# Patient Record
Sex: Female | Born: 1985 | Race: Black or African American | Hispanic: No | Marital: Single | Smoking: Never smoker
Health system: Southern US, Community
[De-identification: ages and names within clinical notes are randomized; demographics above are authoritative.]

## PROBLEM LIST (undated history)

## (undated) DIAGNOSIS — J45909 Unspecified asthma, uncomplicated: Secondary | ICD-10-CM

---

## 2016-06-03 ENCOUNTER — Ambulatory Visit (INDEPENDENT_AMBULATORY_CARE_PROVIDER_SITE_OTHER): Payer: Managed Care, Other (non HMO) | Admitting: Family Medicine

## 2016-06-03 VITALS — BP 130/80 | HR 89 | Temp 97.6°F | Resp 17 | Ht 67.0 in | Wt 307.0 lb

## 2016-06-03 DIAGNOSIS — N926 Irregular menstruation, unspecified: Secondary | ICD-10-CM | POA: Diagnosis not present

## 2016-06-03 DIAGNOSIS — R0689 Other abnormalities of breathing: Secondary | ICD-10-CM

## 2016-06-03 LAB — POCT CBC
GRANULOCYTE PERCENT: 60.7 % (ref 37–80)
HCT, POC: 36.4 % — AB (ref 37.7–47.9)
Hemoglobin: 12.4 g/dL (ref 12.2–16.2)
Lymph, poc: 3.2 (ref 0.6–3.4)
MCH: 26.8 pg — AB (ref 27–31.2)
MCHC: 34.1 g/dL (ref 31.8–35.4)
MCV: 78.7 fL — AB (ref 80–97)
MID (CBC): 0.4 (ref 0–0.9)
MPV: 7.3 fL (ref 0–99.8)
POC Granulocyte: 5.5 (ref 2–6.9)
POC LYMPH PERCENT: 35.1 %L (ref 10–50)
POC MID %: 4.2 % (ref 0–12)
Platelet Count, POC: 328 10*3/uL (ref 142–424)
RBC: 4.63 M/uL (ref 4.04–5.48)
RDW, POC: 14.7 %
WBC: 9.1 10*3/uL (ref 4.6–10.2)

## 2016-06-03 LAB — POCT URINALYSIS DIP (MANUAL ENTRY)
BILIRUBIN UA: NEGATIVE
GLUCOSE UA: NEGATIVE
Ketones, POC UA: NEGATIVE
Leukocytes, UA: NEGATIVE
NITRITE UA: NEGATIVE
Spec Grav, UA: >=1.03
Urobilinogen, UA: 0.2
pH, UA: 5.5

## 2016-06-03 LAB — POC MICROSCOPIC URINALYSIS (UMFC): MUCUS RE: ABSENT

## 2016-06-03 MED ORDER — ALBUTEROL SULFATE (2.5 MG/3ML) 0.083% IN NEBU
2.5000 mg | INHALATION_SOLUTION | Freq: Once | RESPIRATORY_TRACT | Status: AC
  Administered 2016-06-03: 2.5 mg via RESPIRATORY_TRACT

## 2016-06-03 MED ORDER — IPRATROPIUM BROMIDE 0.02 % IN SOLN
0.5000 mg | Freq: Once | RESPIRATORY_TRACT | Status: AC
  Administered 2016-06-03: 0.5 mg via RESPIRATORY_TRACT

## 2016-06-03 NOTE — Progress Notes (Signed)
   Patient ID: Annette Edwards, female    DOB: 12/16/1985, 31 y.o.   MRN: [id number]  PCP: No primary care provider on file.  Chief Complaint  Patient presents with  . abnormal period    Subjective:  HPI  31 year old presents for evaluation of abnormal menstrual cycle. Past medical hx: Asthma  Last menstrual period. Missed period during the month of December. Period has resumed 4 days ago and flowing excessively heavy. Soaking approximately 2-3 pads per hour. She has never engaged in sexual activity nor had a vaginal exam or pap smear. Headache, today, chills over the last few days. Unknown if urinary frequency is present. Changing pads every 20 minutes. Mildly dizziness.   Social History   Social History  . Marital status: Single    Spouse name: N/A  . Number of children: N/A  . Years of education: N/A   Occupational History  . Not on file.   Social History Main Topics  . Smoking status: Never Smoker  . Smokeless tobacco: Never Used  . Alcohol use No  . Drug use: No  . Sexual activity: No   Other Topics Concern  . Not on file   Social History Narrative  . No narrative on file   No family history on file. Review of Systems HPI There are no active problems to display for this patient.   Allergies not on file  Prior to Admission medications   Not on File    Past Medical, Surgical Family and Social History reviewed and updated.    Objective:   Today's Vitals   06/03/16 1725  BP: 130/80  Pulse: 89  Resp: 17  Temp: 97.6 F (36.4 C)  TempSrc: Oral  SpO2: 99%  Weight: (!) 307 lb (139.3 kg)  Height: 5\' 7"  (1.702 m)    Wt Readings from Last 3 Encounters:  06/03/16 (!) 307 lb (139.3 kg)   Physical Exam  Constitutional: She appears well-developed and well-nourished.  HENT:  Head: Normocephalic and atraumatic.  Nose: Nose normal.  Mouth/Throat: Oropharynx is clear and moist.  Eyes: Pupils are equal, round, and reactive to light.  Neck: Normal range  of motion. Neck supple.  Cardiovascular: Normal rate, regular rhythm, normal heart sounds and intact distal pulses.   Pulmonary/Chest: Effort normal.  Coarse posterior upper and middle lung sounds sounds  Abdominal: Soft. Bowel sounds are normal. She exhibits no distension and no mass. There is no tenderness. There is no rebound and no guarding.  Lymphadenopathy:    She has no cervical adenopathy.  Skin: Skin is warm and dry.  Psychiatric: She has a normal mood and affect. Her behavior is normal. Judgment and thought content normal.           Assessment & Plan:  1. Irregular menses - POCT CBC - POCT urinalysis dipstick - POCT Microscopic Urinalysis ([facility]) - Ambulatory referral to Gynecology  2. Adventitious breath sounds - albuterol (PROVENTIL) (2.5 MG/3ML) 0.083% nebulizer solution 2.5 mg; Take 3 mLs (2.5 mg total) by nebulization once. - ipratropium (ATROVENT) nebulizer solution 0.5 mg; Take 2.5 mLs (0.5 mg total) by nebulization once.   Godfrey PickKimberly S. Tiburcio PeaHarris, MSN, FNP-C [facility] at [facility] [phone]

## 2016-06-03 NOTE — Patient Instructions (Addendum)
I have placed an urgent referral for you to see Gynecology.  IF you received an x-ray today, you will receive an invoice from [facility]. Please contact [facility] at [phone] with questions or concerns regarding your invoice.   IF you received labwork today, you will receive an invoice from [facility]. Please contact [facility] at [phone] with questions or concerns regarding your invoice.   Our billing staff will not be able to assist you with questions regarding bills from these companies.  You will be contacted with the lab results as soon as they are available. The fastest way to get your results is to activate your My Chart account. Instructions are located on the last page of this paperwork. If you have not heard from us regarding the results in 2 weeks, please contact this office.      Abnormal Uterine Bleeding Abnormal uterine bleeding means bleeding from the vagina that is not your normal menstrual period. This can be:  Bleeding or spotting between periods.  Bleeding after sex (sexual intercourse).  Bleeding that is heavier or more than normal.  Periods that last longer than usual.  Bleeding after menopause. There are many problems that may cause this. Treatment will depend on the cause of the bleeding. Any kind of bleeding that is not normal should be reviewed by your doctor. Follow these instructions at home: Watch your condition for any changes. These actions may lessen any discomfort you are having:  Do not use tampons or douches as told by your doctor.  Change your pads often. You should get regular pelvic exams and Pap tests. Keep all appointments for tests as told by your doctor. Contact a doctor if:  You are bleeding for more than 1 week.  You feel dizzy at times. Get help right away if:  You pass out.  You have to change pads every 15 to 30 minutes.  You have belly pain.  You have a fever.  You become sweaty or weak.  You  are passing large blood clots from the vagina.  You feel sick to your stomach (nauseous) and throw up (vomit). This information is not intended to replace advice given to you by your health care provider. Make sure you discuss any questions you have with your health care provider. Document Released: 03/10/2009 Document Revised: 10/19/2015 Document Reviewed: 12/10/2012 Elsevier Interactive Patient Education  2017 [employer]

## 2016-06-04 ENCOUNTER — Telehealth: Payer: Self-pay | Admitting: Obstetrics and Gynecology

## 2016-06-04 NOTE — Telephone Encounter (Signed)
Called and left a message for patient to call back to schedule a new patient doctor referral. °

## 2016-06-05 NOTE — Telephone Encounter (Signed)
Called and left a message for patient to call back to schedule a new patient doctor referral. °

## 2016-06-10 NOTE — Telephone Encounter (Signed)
Called and left a message for patient to call back to schedule a new patient doctor referral. °

## 2016-06-11 NOTE — Telephone Encounter (Signed)
Routing referral back to referring office due to patient not returning multiples calls to schedule an appointment with our office.

## 2016-08-12 ENCOUNTER — Telehealth: Payer: Self-pay | Admitting: Family Medicine

## 2016-08-12 NOTE — Telephone Encounter (Signed)
Ok thank you for letting me know I will forward this to whoever is working Teaching laboratory technicianyour box.

## 2016-08-12 NOTE — Telephone Encounter (Signed)
Patient needs Annette CourtsKimberly Edwards to complete accomodation forms for her employer based off her last OV on 06/03/16. I have completed what I could and highlighted what needs to be finished. I will place the forms in your box on 08/12/16 please return them to the FMLA/Disability box at the [employer] checkout desk within 5-7 business days. Thank you!

## 2016-08-12 NOTE — Telephone Encounter (Signed)
Hi Caitlin, I am no longer at the office. I am not certain who will be managing my requests. Please check with Nonnie DoneJill Smith. Thanks

## 2016-08-13 NOTE — Telephone Encounter (Signed)
I will place these forms in Annette Edwards's box per Dr Katrinka BlazingSmith on 08/13/16. please return them to the FMLA/Disability box at the [employer] checkout desk within 5-7 business days. Thank you!

## 2016-08-14 DIAGNOSIS — Z0271 Encounter for disability determination: Secondary | ICD-10-CM

## 2016-08-19 NOTE — Telephone Encounter (Signed)
Completed and placed in FMLA basket. Thanks.

## 2016-08-21 NOTE — Telephone Encounter (Signed)
Paperwork was denied by [place] for reason that she needs to have her GYN complete because they would be able to know more about her condition. I have made the patient aware and refunded her $15 payment.

## 2016-10-02 ENCOUNTER — Emergency Department (HOSPITAL_COMMUNITY): Payer: Managed Care, Other (non HMO)

## 2016-10-02 ENCOUNTER — Emergency Department (HOSPITAL_COMMUNITY)
Admission: EM | Admit: 2016-10-02 | Discharge: 2016-10-03 | Disposition: A | Discharge status: Home / Self Care | Payer: Managed Care, Other (non HMO) | Attending: Emergency Medicine | Admitting: Emergency Medicine

## 2016-10-02 ENCOUNTER — Encounter (HOSPITAL_COMMUNITY): Payer: Self-pay | Admitting: *Deleted

## 2016-10-02 DIAGNOSIS — J45909 Unspecified asthma, uncomplicated: Secondary | ICD-10-CM | POA: Diagnosis present

## 2016-10-02 DIAGNOSIS — J309 Allergic rhinitis, unspecified: Secondary | ICD-10-CM

## 2016-10-02 DIAGNOSIS — J4521 Mild intermittent asthma with (acute) exacerbation: Secondary | ICD-10-CM | POA: Insufficient documentation

## 2016-10-02 DIAGNOSIS — R05 Cough: Secondary | ICD-10-CM

## 2016-10-02 DIAGNOSIS — Z79899 Other long term (current) drug therapy: Secondary | ICD-10-CM | POA: Insufficient documentation

## 2016-10-02 DIAGNOSIS — R059 Cough, unspecified: Secondary | ICD-10-CM

## 2016-10-02 HISTORY — DX: Unspecified asthma, uncomplicated: J45.909

## 2016-10-02 MED ORDER — ALBUTEROL SULFATE (2.5 MG/3ML) 0.083% IN NEBU
5.0000 mg | INHALATION_SOLUTION | Freq: Once | RESPIRATORY_TRACT | Status: AC
  Administered 2016-10-02: 5 mg via RESPIRATORY_TRACT
  Filled 2016-10-02: qty 6

## 2016-10-02 NOTE — ED Provider Notes (Signed)
[facility]-EMERGENCY DEPT Provider Note   CSN: [id number] Arrival date & time: 10/02/16  2214  By signing my name below, I, Annette Edwards, attest that this documentation has been prepared under the direction and in the presence of non-physician practitioner, [address], PA-C. Electronically Signed: Modena Edwards, Scribe. 10/02/2016. 12:21 AM.  History   Chief Complaint Chief Complaint  Patient presents with  . Asthma   The history is provided by the patient and medical records. No language interpreter was used.  Asthma  This is a recurrent problem. The current episode started 3 to 5 hours ago. The problem occurs constantly. The problem has been rapidly improving. Associated symptoms include shortness of breath. Pertinent negatives include no chest pain and no abdominal pain. Nothing aggravates the symptoms. The symptoms are relieved by medications (albuterol). Treatments tried: albuterol. The treatment provided moderate relief.    HPI Comments: Annette Edwards is a 31 y.o. female with a PMHx of asthma who presents to the Emergency Department complaining of asthma attack that occurred about 3 hours ago while at work. She states she had gradually worsening SOB throughout the day, with associated chest tightness, until she had an asthma attack causing wheezing and feeling like she couldn't breathe. She has currently misplaced her personal albuterol inhaler. She was given albuterol breathing treatment (two doses) by EMS on-scene and went by private vehicle to come here, and upon ED arrival received an additional albuterol dose with significant relief. She reports associated b/l ear itching, rhinorrhea, congestion, and cough (productive of yellow clear sputum, onset a week ago) as well, which she states is due to her allergies. She has had 2 asthma attacks prior to today, and today's symptoms feel similar. Denies hx of smoking, recent surgery, recent period of immobilization, family/personal hx of PE/DVT,  estrogen hormone therapy. Denies fevers, chills, ear pain/discharge, sore throat, CP, leg swelling, hemoptysis, abd pain, N/V/D/C, hematuria, dysuria, myalgias, arthralgias, numbness, tingling, focal weakness, or any other complaints at this time.   Past Medical History:  Diagnosis Date  . Asthma     There are no active problems to display for this patient.   History reviewed. No pertinent surgical history.  OB History    No data available       Home Medications    Prior to Admission medications   Medication Sig Start Date End Date Taking? Authorizing Provider  albuterol (PROVENTIL HFA;VENTOLIN HFA) 108 (90 Base) MCG/ACT inhaler Inhale 1-2 puffs into the lungs every 6 (six) hours as needed for wheezing or shortness of breath.   Yes [provider]    Family History No family history on file.  Social History Social History  Substance Use Topics  . Smoking status: Never Smoker  . Smokeless tobacco: Never Used  . Alcohol use No     Allergies   Zithromax [azithromycin]   Review of Systems Review of Systems  Constitutional: Negative for chills and fever.  HENT: Positive for congestion and rhinorrhea. Negative for ear discharge, ear pain and sore throat.        +ear itching  Respiratory: Positive for cough (Productive), chest tightness, shortness of breath and wheezing.   Cardiovascular: Negative for chest pain and leg swelling.  Gastrointestinal: Negative for abdominal pain, constipation, diarrhea, nausea and vomiting.  Genitourinary: Negative for dysuria and hematuria.  Musculoskeletal: Negative for arthralgias and myalgias.  Skin: Negative for color change.  Allergic/Immunologic: Negative for immunocompromised state.  Neurological: Negative for weakness and numbness.  Psychiatric/Behavioral: Negative for confusion.  All  other systems reviewed and are negative for acute change except as noted in the HPI.  Physical Exam Updated Vital Signs BP (!) 146/98  (BP Location: Right Arm)   Pulse 97   Temp 97.9 F (36.6 C) (Oral)   Resp 18   LMP 09/23/2016   SpO2 99%   Physical Exam  Constitutional: She is oriented to person, place, and time. Vital signs are normal. She appears well-developed and well-nourished.  Non-toxic appearance. No distress.  Afebrile, nontoxic, NAD  HENT:  Head: Normocephalic and atraumatic.  Right Ear: Hearing, tympanic membrane, external ear and ear canal normal.  Left Ear: Hearing, tympanic membrane, external ear and ear canal normal.  Nose: Mucosal edema and rhinorrhea present.  Mouth/Throat: Uvula is midline, oropharynx is clear and moist and mucous membranes are normal. No trismus in the jaw. No uvula swelling. Tonsils are 0 on the right. Tonsils are 0 on the left. No tonsillar exudate.  Ears are clear bilaterally. Nose with mild mucosal edema and rhinorrhea. Oropharynx clear and moist, without uvular swelling or deviation, no trismus or drooling, no tonsillar swelling or erythema, no exudates.   Eyes: Conjunctivae and EOM are normal. Right eye exhibits no discharge. Left eye exhibits no discharge.  Neck: Normal range of motion. Neck supple.  Cardiovascular: Normal rate, regular rhythm, normal heart sounds and intact distal pulses.  Exam reveals no gallop and no friction rub.   No murmur heard. Pulmonary/Chest: Effort normal and breath sounds normal. No respiratory distress. She has no decreased breath sounds. She has no wheezes. She has no rhonchi. She has no rales.  CTAB in all lung fields, no w/r/r, no hypoxia or increased WOB, speaking in full sentences, SpO2 99% on RA  Abdominal: Soft. Normal appearance and bowel sounds are normal. She exhibits no distension. There is no tenderness. There is no rigidity, no rebound, no guarding, no CVA tenderness, no tenderness at McBurney's point and negative Murphy's sign.  Musculoskeletal: Normal range of motion.  MAE x4 Strength and sensation grossly intact in all  extremities Distal pulses intact Gait steady No pedal edema, neg homan's bilaterally  Neurological: She is alert and oriented to person, place, and time. She has normal strength. No sensory deficit.  Skin: Skin is warm, dry and intact. No rash noted.  Psychiatric: She has a normal mood and affect.  Nursing note and vitals reviewed.    ED Treatments / Results  DIAGNOSTIC STUDIES: Oxygen Saturation is 99% on RA, normal by my interpretation.    COORDINATION OF CARE: 12:25 AM- Pt advised of plan for treatment and pt agrees.  Labs (all labs ordered are listed, but only abnormal results are displayed) Labs Reviewed - No data to display  EKG  EKG Interpretation None       Radiology Dg Chest 2 View  Result Date: 10/02/2016 CLINICAL DATA:  Pt says that she has asthma and she had an asthma attack tonight at work. EMS was at the scene and administered albuterol for the same. Pt reports she has had a cough for about a week. EXAM: CHEST  2 VIEW COMPARISON:  10/13/2005 FINDINGS: The heart size and mediastinal contours are within normal limits. Both lungs are clear. The visualized skeletal structures are unremarkable. IMPRESSION: No active cardiopulmonary disease. Electronically Signed   By: Burman Nieves M.D.   On: 10/02/2016 23:26    Procedures Procedures (including critical care time)  Medications Ordered in ED Medications  albuterol (PROVENTIL) (2.5 MG/3ML) 0.083% nebulizer solution 5 mg (5 mg  Nebulization Given 10/02/16 2258)  albuterol (PROVENTIL HFA;VENTOLIN HFA) 108 (90 Base) MCG/ACT inhaler 2 puff (2 puffs Inhalation Given 10/03/16 0049)     Initial Impression / Assessment and Plan / ED Course  I have reviewed the triage vital signs and the nursing notes.  Pertinent labs & imaging results that were available during my care of the patient were reviewed by me and considered in my medical decision making (see chart for details).     31 y.o. female here with asthma  exacerbation today, cough x1 wk. Hx of seasonal allergies, which sometimes triggers asthma. Had 3 albuterol tx and feels better. On exam, clear lungs, no hypoxia, no tachycardia, no LE swelling, ears clear, mild rhinorrhea and congestion c/w allergic rhinitis. Overall symptoms c/w asthma attack, fairly mild, doubt need for repeat nebs or prednisone use. CXR in triage was neg. Will send home with inhaler and advised antihistamines and OTC symptomatic relief meds. F/up with PCP in 1wk. Doubt need for further emergent work up today. I explained the diagnosis and have given explicit precautions to return to the ER including for any other new or worsening symptoms. The patient understands and accepts the medical plan as it's been dictated and I have answered their questions. Discharge instructions concerning home care and prescriptions have been given. The patient is STABLE and is discharged to home in good condition.   I personally performed the services described in this documentation, which was scribed in my presence. The recorded information has been reviewed and is accurate.   Final Clinical Impressions(s) / ED Diagnoses   Final diagnoses:  Mild intermittent asthma with exacerbation  Cough  Allergic rhinitis, unspecified seasonality, unspecified trigger    New Prescriptions New Prescriptions   ALBUTEROL (PROVENTIL HFA;VENTOLIN HFA) 108 (90 BASE) MCG/ACT INHALER    Inhale 2 puffs into the lungs every 4 (four) hours as needed for wheezing or shortness of breath (cough).     [address], [place], [place] 10/03/16 [id number]    Nicanor AlconPalumbo, April, MD 10/03/16 [phone]

## 2016-10-02 NOTE — ED Triage Notes (Signed)
Pt says that she has asthma and she had an asthma attack tonight at work. EMS was at the scene and administered albuterol for the same. Pt reports she has had a cough for about a week.

## 2016-10-03 MED ORDER — ALBUTEROL SULFATE HFA 108 (90 BASE) MCG/ACT IN AERS
2.0000 | INHALATION_SPRAY | Freq: Once | RESPIRATORY_TRACT | Status: AC
  Administered 2016-10-03: 2 via RESPIRATORY_TRACT
  Filled 2016-10-03: qty 6.7

## 2016-10-03 MED ORDER — ALBUTEROL SULFATE HFA 108 (90 BASE) MCG/ACT IN AERS: 2.0000 | INHALATION_SPRAY | RESPIRATORY_TRACT | 0 refills | Status: DC | PRN

## 2016-10-03 NOTE — Discharge Instructions (Signed)
Continue to stay well-hydrated. Use Mucinex for cough suppression/expectoration of mucus. Use over the counter antihistamines such as zyrtec or allegra to decrease symptoms and frequency of asthma attacks. Use inhaler as directed, as needed for cough/chest congestion/wheezing/shortness of breath/etc. Followup with your primary care doctor in 5-7 days for recheck of ongoing symptoms. Return to emergency department for emergent changing or worsening of symptoms.

## 2017-04-25 ENCOUNTER — Ambulatory Visit: Payer: Self-pay | Attending: Internal Medicine

## 2017-04-25 DIAGNOSIS — J45909 Unspecified asthma, uncomplicated: Secondary | ICD-10-CM | POA: Insufficient documentation

## 2017-04-25 MED ORDER — IPRATROPIUM-ALBUTEROL 0.5-2.5 (3) MG/3ML IN SOLN
3.0000 mL | Freq: Four times a day (QID) | RESPIRATORY_TRACT | Status: DC
  Administered 2017-04-25: 3 mL via RESPIRATORY_TRACT

## 2017-04-25 NOTE — Progress Notes (Signed)
Pt walk into to clinic with trouble breathing. Pt was unable to speak at time of arrival. EMS was called and pt was brought back to start a breathing treatment. Unable to obtain vitals due to EMS arrival. Pt refused to go with EMS.

## 2018-03-31 ENCOUNTER — Ambulatory Visit: Payer: Self-pay | Admitting: Family Medicine

## 2018-12-03 ENCOUNTER — Encounter: Payer: Self-pay | Admitting: Emergency Medicine

## 2018-12-03 ENCOUNTER — Other Ambulatory Visit: Payer: Self-pay

## 2018-12-03 ENCOUNTER — Ambulatory Visit
Admission: EM | Admit: 2018-12-03 | Discharge: 2018-12-03 | Disposition: A | Discharge status: Home / Self Care | Payer: Self-pay | Attending: Family Medicine | Admitting: Family Medicine

## 2018-12-03 DIAGNOSIS — H60501 Unspecified acute noninfective otitis externa, right ear: Secondary | ICD-10-CM

## 2018-12-03 DIAGNOSIS — Z760 Encounter for issue of repeat prescription: Secondary | ICD-10-CM

## 2018-12-03 DIAGNOSIS — H6981 Other specified disorders of Eustachian tube, right ear: Secondary | ICD-10-CM

## 2018-12-03 MED ORDER — PREDNISONE 50 MG PO TABS: ORAL_TABLET | ORAL | 0 refills | Status: DC

## 2018-12-03 MED ORDER — NEOMYCIN-POLYMYXIN-HC 3.5-10000-1 OT SUSP: 3.0000 [drp] | Freq: Three times a day (TID) | OTIC | 0 refills | Status: AC

## 2018-12-03 MED ORDER — ALBUTEROL SULFATE 0.63 MG/3ML IN NEBU: 1.0000 | INHALATION_SOLUTION | Freq: Four times a day (QID) | RESPIRATORY_TRACT | 0 refills | Status: AC | PRN

## 2018-12-03 MED ORDER — FLUTICASONE PROPIONATE 50 MCG/ACT NA SUSP: 1.0000 | Freq: Every day | NASAL | 0 refills | Status: AC

## 2018-12-03 MED ORDER — ALBUTEROL SULFATE HFA 108 (90 BASE) MCG/ACT IN AERS: 1.0000 | INHALATION_SPRAY | Freq: Four times a day (QID) | RESPIRATORY_TRACT | 0 refills | Status: DC | PRN

## 2018-12-03 NOTE — ED Triage Notes (Addendum)
Pt presents to [facility] for assessment of diminished hearing in right ear.  Denies any pain.  Pt is currently wearing an ear plug in her ear.

## 2018-12-03 NOTE — Discharge Instructions (Signed)
Begin using Cortisporin to treat outer ear irritation  I believe your symptoms are most likely related to fluid on the ears-please read attached Begin Flonase nasal spray 1 to 2 sprays in each nostril daily Also begin a course of prednisone daily for 5 days  I have refilled your albuterol inhaler and nebulizers  Please follow-up if symptoms not resolving in approximately 1 to 2 weeks or developing pain, fevers

## 2018-12-04 NOTE — ED Provider Notes (Signed)
[facility]    CSN: [id number] Arrival date & time: 12/03/18  1701      History   Chief Complaint Chief Complaint  Patient presents with  . Ear Fullness    HPI Eisha Chatterjee is a 33 y.o. female history of asthma presenting today for evaluation of right ear fullness. She has had decreased hearing, muffled sensation and echoing of voice since 1 pm today. Denies associated pain. Feels as if there is slight discomfort externally. Denies drainage. Tried warm water and peroxide. Denies associated URI symptoms.   Requesting refill of albuterol.   HPI  Past Medical History:  Diagnosis Date  . Asthma     There are no active problems to display for this patient.   History reviewed. No pertinent surgical history.  OB History   No obstetric history on file.      Home Medications    Prior to Admission medications   Medication Sig Start Date End Date Taking? Authorizing Provider  albuterol (ACCUNEB) 0.63 MG/3ML nebulizer solution Take 3 mLs (0.63 mg total) by nebulization every 6 (six) hours as needed for wheezing. 12/03/18   Bernal Luhman C, PA-C  albuterol (VENTOLIN HFA) 108 (90 Base) MCG/ACT inhaler Inhale 1-2 puffs into the lungs every 6 (six) hours as needed for wheezing or shortness of breath. 12/03/18   Star Resler C, PA-C  fluticasone (FLONASE) 50 MCG/ACT nasal spray Place 1-2 sprays into both nostrils daily for 14 days. 12/03/18 12/17/18  Ryelle Ruvalcaba C, PA-C  neomycin-polymyxin-hydrocortisone (CORTISPORIN) 3.5-10000-1 OTIC suspension Place 3 drops into the right ear 3 (three) times daily for 7 days. 12/03/18 12/10/18  Ilian Wessell C, PA-C  predniSONE (DELTASONE) 50 MG tablet Take 50 mg daily for 5 days with food 12/03/18   Skylin Kennerson, Elesa Hacker, PA-C    Family History History reviewed. No pertinent family history.  Social History Social History   Tobacco Use  . Smoking status: Never Smoker  . Smokeless tobacco: Never Used  Substance Use Topics  .  Alcohol use: No  . Drug use: No     Allergies   Zithromax [azithromycin]   Review of Systems Review of Systems  Constitutional: Negative for activity change, appetite change, chills, fatigue and fever.  HENT: Positive for hearing loss. Negative for congestion, ear pain, rhinorrhea, sinus pressure, sore throat and trouble swallowing.   Eyes: Negative for discharge and redness.  Respiratory: Negative for cough, chest tightness and shortness of breath.   Cardiovascular: Negative for chest pain.  Gastrointestinal: Negative for abdominal pain, diarrhea, nausea and vomiting.  Musculoskeletal: Negative for myalgias.  Skin: Negative for rash.  Neurological: Negative for dizziness, light-headedness and headaches.     Physical Exam Triage Vital Signs ED Triage Vitals  Enc Vitals Group     BP 12/03/18 1715 (!) 148/92     Pulse Rate 12/03/18 1715 100     Resp 12/03/18 1715 18     Temp 12/03/18 1715 99.1 F (37.3 C)     Temp Source 12/03/18 1715 Oral     SpO2 12/03/18 1715 96 %     Weight --      Height --      Head Circumference --      Peak Flow --      Pain Score 12/03/18 1711 0     Pain Loc --      Pain Edu? --      Excl. in [place]? --    No data found.  Updated Vital Signs  BP (!) 148/92 (BP Location: Left Wrist)   Pulse 100   Temp 99.1 F (37.3 C) (Oral)   Resp 18   SpO2 96%   Visual Acuity Right Eye Distance:   Left Eye Distance:   Bilateral Distance:    Right Eye Near:   Left Eye Near:    Bilateral Near:     Physical Exam Vitals signs and nursing note reviewed.  Constitutional:      Appearance: She is well-developed.     Comments: No acute distress  HENT:     Head: Normocephalic and atraumatic.     Ears:     Comments: Left canal and TM without abnormality  Right external auricle without tenderness to palpation, no swelling Right EAC with mild erythema, no swelling TM intact, appears slightly opaque, no obvious effusion, no erythema    Nose: Nose  normal.     Comments: Nasal mucosa pink, turbinates non swollen    Mouth/Throat:     Comments: Oral mucosa pink and moist, no tonsillar enlargement or exudate. Posterior pharynx patent and nonerythematous, no uvula deviation or swelling. Normal phonation.  Eyes:     Conjunctiva/sclera: Conjunctivae normal.  Neck:     Musculoskeletal: Neck supple.  Cardiovascular:     Rate and Rhythm: Normal rate.  Pulmonary:     Effort: Pulmonary effort is normal. No respiratory distress.  Abdominal:     General: There is no distension.  Musculoskeletal: Normal range of motion.  Skin:    General: Skin is warm and dry.  Neurological:     Mental Status: She is alert and oriented to person, place, and time.      UC Treatments / Results  Labs (all labs ordered are listed, but only abnormal results are displayed) Labs Reviewed - No data to display  EKG   Radiology No results found.  Procedures Procedures (including critical care time)  Medications Ordered in UC Medications - No data to display  Initial Impression / Assessment and Plan / UC Course  I have reviewed the triage vital signs and the nursing notes.  Pertinent labs & imaging results that were available during my care of the patient were reviewed by me and considered in my medical decision making (see chart for details).     Likely eustachian tube dysfunction, recommending flonase, 5 days course of prednisone. Also will cover for external otitis given erythema with cortisporin. Continue to monitor.   Refilled albuterol .  Discussed strict return precautions. Patient verbalized understanding and is agreeable with plan.  Final Clinical Impressions(s) / UC Diagnoses   Final diagnoses:  Dysfunction of right eustachian tube  Acute otitis externa of right ear, unspecified type     Discharge Instructions     Begin using Cortisporin to treat outer ear irritation  I believe your symptoms are most likely related to fluid on  the ears-please read attached Begin Flonase nasal spray 1 to 2 sprays in each nostril daily Also begin a course of prednisone daily for 5 days  I have refilled your albuterol inhaler and nebulizers  Please follow-up if symptoms not resolving in approximately 1 to 2 weeks or developing pain, fevers   ED Prescriptions    Medication Sig Dispense Auth. Provider   neomycin-polymyxin-hydrocortisone (CORTISPORIN) 3.5-10000-1 OTIC suspension Place 3 drops into the right ear 3 (three) times daily for 7 days. 10 mL Maurisha Mongeau C, PA-C   fluticasone (FLONASE) 50 MCG/ACT nasal spray Place 1-2 sprays into both nostrils daily for 14 days.  1 g Zetta Stoneman C, PA-C   albuterol (VENTOLIN HFA) 108 (90 Base) MCG/ACT inhaler Inhale 1-2 puffs into the lungs every 6 (six) hours as needed for wheezing or shortness of breath. 8 g Zuleyma Scharf C, PA-C   albuterol (ACCUNEB) 0.63 MG/3ML nebulizer solution Take 3 mLs (0.63 mg total) by nebulization every 6 (six) hours as needed for wheezing. 75 mL Damico Partin C, PA-C   predniSONE (DELTASONE) 50 MG tablet Take 50 mg daily for 5 days with food 5 tablet Mixtli Reno C, PA-C     Controlled Substance Prescriptions  Controlled Substance Registry consulted? Not Applicable   Lew DawesWieters, Cecylia Brazill C, [place]C 12/04/18 1151

## 2018-12-09 ENCOUNTER — Encounter: Payer: Self-pay | Admitting: Urgent Care

## 2019-03-24 ENCOUNTER — Ambulatory Visit
Admission: EM | Admit: 2019-03-24 | Discharge: 2019-03-24 | Disposition: A | Discharge status: Home / Self Care | Payer: BC Managed Care – PPO | Attending: Emergency Medicine | Admitting: Emergency Medicine

## 2019-03-24 ENCOUNTER — Other Ambulatory Visit: Payer: Self-pay

## 2019-03-24 ENCOUNTER — Encounter: Payer: Self-pay | Admitting: Emergency Medicine

## 2019-03-24 DIAGNOSIS — J4531 Mild persistent asthma with (acute) exacerbation: Secondary | ICD-10-CM | POA: Diagnosis not present

## 2019-03-24 MED ORDER — PREDNISONE 20 MG PO TABS: 20.0000 mg | ORAL_TABLET | Freq: Every day | ORAL | 0 refills | Status: AC

## 2019-03-24 MED ORDER — ALBUTEROL SULFATE HFA 108 (90 BASE) MCG/ACT IN AERS: 1.0000 | INHALATION_SPRAY | Freq: Four times a day (QID) | RESPIRATORY_TRACT | 0 refills | Status: AC | PRN

## 2019-03-24 NOTE — ED Triage Notes (Signed)
Pt presents to [facility] for assessment of worsening asthma with SOB.  Also states she had a flair up of thrush recently, which she treated with vinegar at home and is resolved, except the burning sensation to her tongue.

## 2019-03-24 NOTE — Discharge Instructions (Signed)
Be sure to swish/spit water after using your albuterol inhaler. May take vitamin B complex OTC-recommend establishing care with PCP for further evaluation/management if no improvement. Take steroid once daily after breakfast. Go to ER for further evaluation for worsening breathing, cough, shortness of breath, chest pain.

## 2019-03-24 NOTE — ED Notes (Signed)
Patient able to ambulate independently  

## 2019-03-24 NOTE — ED Provider Notes (Signed)
[facility]    CSN: [id number] Arrival date & time: 03/24/19  1102      History   Chief Complaint Chief Complaint  Patient presents with  . Asthma    HPI Nathanial RancherQuinn Route is a 33 y.o. female presenting for asthma flare.  Patient states she has been having increased dyspnea on exertion, wheezing over the last few weeks.  She ran out of her albuterol inhaler recently, which prompted today's visit.  States she was getting adequate relief of symptoms with this.  Denies fever, known sick contacts, productive cough, chest pain.  States she had an episode of thrush, which she is to have chronically as a child, which she self treated with vinegar at home with relief of oral lesions.  Patient states that she has noticed a burning sensation to her tongue.  Denies dry mouth, sore throat, dysphagia.  Of note, patient has not been rinsing her mouth after albuterol inhaler use.  States she used to eat yogurt to prevent thrush infections and discontinued this regimen a few days prior to developing thrush.  Past Medical History:  Diagnosis Date  . Asthma     There are no active problems to display for this patient.   History reviewed. No pertinent surgical history.  OB History   No obstetric history on file.      Home Medications    Prior to Admission medications   Medication Sig Start Date End Date Taking? Authorizing Provider  albuterol (ACCUNEB) 0.63 MG/3ML nebulizer solution Take 3 mLs (0.63 mg total) by nebulization every 6 (six) hours as needed for wheezing. 12/03/18  Yes Wieters, Hallie C, PA-C  albuterol (VENTOLIN HFA) 108 (90 Base) MCG/ACT inhaler Inhale 1-2 puffs into the lungs every 6 (six) hours as needed for wheezing or shortness of breath. 03/24/19   Hall-Potvin, [place], PA-C  fluticasone (FLONASE) 50 MCG/ACT nasal spray Place 1-2 sprays into both nostrils daily for 14 days. 12/03/18 12/17/18  Wieters, Hallie C, PA-C  predniSONE (DELTASONE) 20 MG tablet Take 1 tablet  (20 mg total) by mouth daily for 7 days. 03/24/19 03/31/19  Hall-Potvin, [place], PA-C    Family History History reviewed. No pertinent family history.  Social History Social History   Tobacco Use  . Smoking status: Never Smoker  . Smokeless tobacco: Never Used  Substance Use Topics  . Alcohol use: No  . Drug use: No     Allergies   Zithromax [azithromycin]   Review of Systems Review of Systems  Constitutional: Negative for fatigue and fever.  HENT: Negative for ear pain, mouth sores, sinus pain and voice change.   Eyes: Negative for pain, redness and visual disturbance.  Respiratory: Positive for cough, chest tightness and wheezing. Negative for shortness of breath.   Cardiovascular: Negative for chest pain, palpitations and leg swelling.  Gastrointestinal: Negative for abdominal pain, diarrhea and vomiting.  Musculoskeletal: Negative for arthralgias and myalgias.  Skin: Negative for rash and wound.  Neurological: Negative for syncope and headaches.     Physical Exam Triage Vital Signs ED Triage Vitals  Enc Vitals Group     BP      Pulse      Resp      Temp      Temp src      SpO2      Weight      Height      Head Circumference      Peak Flow      Pain Score  Pain Loc      Pain Edu?      Excl. in [place]?    No data found.  Updated Vital Signs BP 131/86 (BP Location: Left Arm)   Pulse (!) 107   Temp 98.5 F (36.9 C) (Oral)   Resp 18   SpO2 96%   Visual Acuity Right Eye Distance:   Left Eye Distance:   Bilateral Distance:    Right Eye Near:   Left Eye Near:    Bilateral Near:     Physical Exam Constitutional:      General: She is not in acute distress.    Appearance: She is obese. She is not ill-appearing.  HENT:     Head: Normocephalic and atraumatic.     Mouth/Throat:     Mouth: Mucous membranes are moist.     Pharynx: Oropharynx is clear. No oropharyngeal exudate or posterior oropharyngeal erythema.     Comments: No buccal or  mucosal oral lesions.  Distal aspect of tongue with mild glossitis Eyes:     General: No scleral icterus.    Extraocular Movements: Extraocular movements intact.     Conjunctiva/sclera: Conjunctivae normal.     Pupils: Pupils are equal, round, and reactive to light.  Neck:     Musculoskeletal: Neck supple. No muscular tenderness.     Comments: Trachea midline Cardiovascular:     Rate and Rhythm: Tachycardia present.     Heart sounds: No murmur. No gallop.      Comments: HR 98-106 during time with provider.  Patient states this is chronic/stable Pulmonary:     Effort: Pulmonary effort is normal. No respiratory distress.     Breath sounds: No stridor. Wheezing present. No rhonchi or rales.  Lymphadenopathy:     Cervical: No cervical adenopathy.  Skin:    Capillary Refill: Capillary refill takes less than 2 seconds.     Coloration: Skin is not jaundiced or pale.     Findings: No rash.  Neurological:     General: No focal deficit present.     Mental Status: She is alert and oriented to person, place, and time.  Psychiatric:        Mood and Affect: Mood normal.        Behavior: Behavior normal.      UC Treatments / Results  Labs (all labs ordered are listed, but only abnormal results are displayed) Labs Reviewed - No data to display  EKG   Radiology No results found.  Procedures Procedures (including critical care time)  Medications Ordered in UC Medications - No data to display  Initial Impression / Assessment and Plan / UC Course  I have reviewed the triage vital signs and the nursing notes.  Pertinent labs & imaging results that were available during my care of the patient were reviewed by me and considered in my medical decision making (see chart for details).     Patient hemodynamically stable, O2 greater than 94%, pleasant affect, calm throughout visit.  Patient appears to be well educated on her condition.  Will refill inhaler, add prednisone to current  regimen.  Discussed importance of rinsing mouth after inhaler use to prevent episodes of thrush.  No active signs of infection at this time.  Discussed glossitis is likely second to prolonged vinegar exposure - will hold - though may trial vitamin B supplementation for additional relief.  We will defer further work-up/management to PCP. Return precautions discussed, patient verbalized understanding and is agreeable to plan. Final Clinical Impressions(s) /  UC Diagnoses   Final diagnoses:  Mild persistent asthma with acute exacerbation     Discharge Instructions     Be sure to swish/spit water after using your albuterol inhaler. May take vitamin B complex OTC-recommend establishing care with PCP for further evaluation/management if no improvement. Take steroid once daily after breakfast. Go to ER for further evaluation for worsening breathing, cough, shortness of breath, chest pain.    ED Prescriptions    Medication Sig Dispense Auth. Provider   predniSONE (DELTASONE) 20 MG tablet Take 1 tablet (20 mg total) by mouth daily for 7 days. 7 tablet Hall-Potvin, [place], PA-C   albuterol (VENTOLIN HFA) 108 (90 Base) MCG/ACT inhaler Inhale 1-2 puffs into the lungs every 6 (six) hours as needed for wheezing or shortness of breath. 8 g Hall-Potvin, [place], PA-C     PDMP not reviewed this encounter.   Hall-Potvin, [place], [place] 03/25/19 [id number]

## 2019-07-02 ENCOUNTER — Emergency Department (HOSPITAL_COMMUNITY): Payer: BC Managed Care – PPO

## 2019-07-02 ENCOUNTER — Emergency Department (HOSPITAL_COMMUNITY)
Admission: EM | Admit: 2019-07-02 | Discharge: 2019-07-02 | Disposition: A | Discharge status: Home / Self Care | Payer: BC Managed Care – PPO | Attending: Emergency Medicine | Admitting: Emergency Medicine

## 2019-07-02 ENCOUNTER — Telehealth: Payer: Self-pay | Admitting: Family Medicine

## 2019-07-02 ENCOUNTER — Encounter (HOSPITAL_COMMUNITY): Payer: Self-pay

## 2019-07-02 ENCOUNTER — Other Ambulatory Visit: Payer: Self-pay

## 2019-07-02 DIAGNOSIS — M545 Low back pain, unspecified: Secondary | ICD-10-CM

## 2019-07-02 DIAGNOSIS — J45909 Unspecified asthma, uncomplicated: Secondary | ICD-10-CM | POA: Diagnosis not present

## 2019-07-02 LAB — POC URINE PREG, ED: Preg Test, Ur: NEGATIVE

## 2019-07-02 MED ORDER — NAPROXEN 500 MG PO TABS
500.0000 mg | ORAL_TABLET | Freq: Once | ORAL | Status: DC
  Filled 2019-07-02: qty 1

## 2019-07-02 MED ORDER — LIDOCAINE 5 % EX PTCH
1.0000 | MEDICATED_PATCH | CUTANEOUS | Status: DC
  Administered 2019-07-02: 1 via TRANSDERMAL
  Filled 2019-07-02: qty 1

## 2019-07-02 MED ORDER — LIDOCAINE 5 % EX PTCH: 1.0000 | MEDICATED_PATCH | CUTANEOUS | 0 refills | Status: AC

## 2019-07-02 MED ORDER — DIAZEPAM 5 MG PO TABS
5.0000 mg | ORAL_TABLET | Freq: Once | ORAL | Status: AC
  Administered 2019-07-02: 20:00:00 5 mg via ORAL
  Filled 2019-07-02: qty 1

## 2019-07-02 MED ORDER — NAPROXEN 500 MG PO TABS: 500.0000 mg | ORAL_TABLET | Freq: Two times a day (BID) | ORAL | 0 refills | Status: AC

## 2019-07-02 MED ORDER — METHOCARBAMOL 500 MG PO TABS: 500.0000 mg | ORAL_TABLET | Freq: Three times a day (TID) | ORAL | 0 refills | Status: AC | PRN

## 2019-07-02 MED ORDER — KETOROLAC TROMETHAMINE 60 MG/2ML IM SOLN
60.0000 mg | Freq: Once | INTRAMUSCULAR | Status: DC
  Administered 2019-07-02: 20:00:00 60 mg via INTRAMUSCULAR
  Filled 2019-07-02: qty 2

## 2019-07-02 NOTE — Discharge Instructions (Addendum)
You were seen in the emergency department for back pain today.  At this time we suspect that your pain is related to a muscle strain/spasm.  Your x-ray did not show a fracture.  Your pregnancy test was negative.  I have prescribed you an anti-inflammatory medication and a muscle relaxer.  - Naproxen is a nonsteroidal anti-inflammatory medication that will help with pain and swelling. Be sure to take this medication as prescribed with food, 1 pill every 12 hours,  It should be taken with food, as it can cause stomach upset, and more seriously, stomach bleeding. Do not take other nonsteroidal anti-inflammatory medications with this such as Advil, Motrin, Aleve, Mobic, Goodie Powder, or Motrin.    - Robaxin is the muscle relaxer I have prescribed, this is meant to help with muscle tightness. Be aware that this medication may make you drowsy therefore the first time you take this it should be at a time you are in an environment where you can rest. Do not drive or operate heavy machinery when taking this medication. Do not drink alcohol or take other sedating medications with this medicine such as narcotics or benzodiazepines.   - Lidoderm patch-is a topical patch to place directly over your area of most significant pain once per day to help numb/to the area. You make take Tylenol per over the counter dosing with these medications.   We have prescribed you new medication(s) today. Discuss the medications prescribed today with your pharmacist as they can have adverse effects and interactions with your other medicines including over the counter and prescribed medications. Seek medical evaluation if you start to experience new or abnormal symptoms after taking one of these medicines, seek care immediately if you start to experience difficulty breathing, feeling of your throat closing, facial swelling, or rash as these could be indications of a more serious allergic reaction   The application of heat can help  soothe the pain.  Please follow-up with primary care and/or orthopedics within 1 week for reevaluation.  However return to the ER should you develop ne or worsening symptoms or any other concerns including but not limited to severe or worsening pain, low back pain with fever, numbness, weakness, loss of bowel or bladder control, or inability to walk or urinate, you should return to the ER immediately.

## 2019-07-02 NOTE — ED Triage Notes (Signed)
Patient c/o left lower back pain since yesterday. Patient states she has massaged, took OTC meds, and exercises with no relief.  Patient went to a chiropractor today and was sent to the ED.  Patient denies any pain radiating down her left leg.

## 2019-07-02 NOTE — Telephone Encounter (Signed)
Pt called wanting an appointment for her back pain. Her last dos was 06/03/16; therefore she is considered an new pt. She stated we were her primary and she was just here 6 months ago.  We also only have virtual visits left for the day. I informed her-we had nothing left available for her to be seen today for back pain. She started asking me-are you refusing me service? I said no and she asked again. I then went and got Delores to talk with pt.

## 2019-07-02 NOTE — ED Provider Notes (Signed)
Annette Edwards   CSN: [id number] Arrival date & time: 07/02/19  1744     History Chief Complaint  Patient presents with  . Back Pain    Annette Edwards is a 34 y.o. female with a history of asthma who presents to the emergency department with complaints of back pain that began a few days ago.  Patient states that she woke up and got out of bed and as she was getting up she felt onset of pain to the lower back.  She states is located centrally but does radiate to the diffuse lower back on both sides.  Pain is constant, worse with movement/position changes, no alleviating factors.  Tried NSAIDs and Epsom salts.  Was planning to go to the chiropractor today but given her midline pain they recommended she come to the emergency department for imaging.  She does not recall specific traumatic injury/falls.  She has been increasing her cardio type exercise recently. Denies numbness, tingling, weakness, saddle anesthesia, incontinence to bowel/bladder, fever, chills, IV drug use, dysuria, or hx of cancer. Patient has not had prior back surgeries.  HPI     Past Medical History:  Diagnosis Date  . Asthma     There are no problems to display for this patient.   History reviewed. No pertinent surgical history.   OB History   No obstetric history on file.     History reviewed. No pertinent family history.  Social History   Tobacco Use  . Smoking status: Never Smoker  . Smokeless tobacco: Never Used  Substance Use Topics  . Alcohol use: No  . Drug use: No    Home Medications Prior to Admission medications   Medication Sig Start Date End Date Taking? Authorizing Provider  albuterol (ACCUNEB) 0.63 MG/3ML nebulizer solution Take 3 mLs (0.63 mg total) by nebulization every 6 (six) hours as needed for wheezing. 12/03/18   Wieters, Hallie C, PA-C  albuterol (VENTOLIN HFA) 108 (90 Base) MCG/ACT inhaler Inhale 1-2 puffs into the lungs every 6  (six) hours as needed for wheezing or shortness of breath. 03/24/19   Hall-Potvin, [place], PA-C  fluticasone (FLONASE) 50 MCG/ACT nasal spray Place 1-2 sprays into both nostrils daily for 14 days. 12/03/18 12/17/18  Wieters, Hallie C, PA-C    Allergies    Zithromax [azithromycin]  Review of Systems   Review of Systems  Constitutional: Negative for chills and fever.  Respiratory: Negative for shortness of breath.   Cardiovascular: Negative for chest pain.  Gastrointestinal: Negative for abdominal pain, nausea and vomiting.  Genitourinary: Negative for dysuria and hematuria.  Musculoskeletal: Positive for back pain.  Neurological: Negative for weakness and numbness.       Negative for incontinence or saddle anesthesia.    Physical Exam Updated Vital Signs BP (!) 148/99 (BP Location: Left Arm)   Pulse 84   Temp 98.2 F (36.8 C) (Oral)   Resp 16   Ht 5\' 5"  (1.651 m)   Wt (!) 143.3 kg   LMP 06/25/2019 (Approximate)   SpO2 98%   BMI 52.59 kg/m   Physical Exam Constitutional:      General: She is not in acute distress.    Appearance: She is well-developed. She is not toxic-appearing.  HENT:     Head: Normocephalic and atraumatic.  Cardiovascular:     Rate and Rhythm: Normal rate and regular rhythm.  Pulmonary:     Effort: Pulmonary effort is normal.     Breath sounds: Normal  breath sounds.  Abdominal:     Palpations: Abdomen is soft.     Tenderness: There is no abdominal tenderness. There is no guarding or rebound.  Musculoskeletal:     Cervical back: Normal range of motion and neck supple. No spinous process tenderness or muscular tenderness.     Comments: No obvious deformity, appreciable swelling, erythema, ecchymosis, significant open wounds, or increased warmth.  Extremities: Normal ROM. Nontender.  Back: Diffuse tenderness throughout the lumbar region including midline and bilateral paraspinal muscles.  No point/focal vertebral tenderness, no palpable step off or  crepitus.   Skin:    General: Skin is warm and dry.     Findings: No rash.  Neurological:     Mental Status: She is alert.     Deep Tendon Reflexes:     Reflex Scores:      Patellar reflexes are 2+ on the right side and 2+ on the left side.    Comments: Sensation grossly intact to bilateral lower extremities. 5/5 symmetric strength with plantar/dorsiflexion bilaterally. Gait is intact without obvious foot drop, however is noted to be antalgic.    ED Results / Procedures / Treatments   Labs (all labs ordered are listed, but only abnormal results are displayed) Labs Reviewed  POC URINE PREG, ED    EKG None  Radiology DG Lumbar Spine Complete  Result Date: 07/02/2019 CLINICAL DATA:  Back pain EXAM: LUMBAR SPINE - COMPLETE 4+ VIEW COMPARISON:  None. FINDINGS: There is no evidence of lumbar spine fracture. Alignment is normal. Intervertebral disc spaces are maintained. IMPRESSION: Negative. Electronically Signed   By: Jonna Clark M.D.   On: 07/02/2019 20:48    Procedures Procedures (including critical care time)  Medications Ordered in ED Medications  lidocaine (LIDODERM) 5 % 1 patch (1 patch Transdermal Patch Applied 07/02/19 1936)  naproxen (NAPROSYN) tablet 500 mg (500 mg Oral Not Given 07/02/19 2018)  diazepam (VALIUM) tablet 5 mg (5 mg Oral Given 07/02/19 1936)    ED Course  I have reviewed the triage vital signs and the nursing notes.  Pertinent labs & imaging results that were available during my care of the patient were reviewed by me and considered in my medical decision making (see chart for details).    MDM Rules/Calculators/A&P                      Patient presents with complaint of back pain.  Patient is nontoxic appearing, vitals are WNL other than elevated BP- doubt HTN emergency.  Pregnancy test negative.  L-spine x-ray without acute process.  Patient has normal neurologic exam, no point/focal midline tenderness to palpation. Ambulatory in the ED.  No back pain red  flags. No urinary sxs. Most likely muscle strain versus spasm. Considered UTI/pyelonephritis, kidney stone, aortic aneurysm/dissection, cauda equina or epidural abscess however these do not feel these diagnoses fit clinical picture at this time. Will treat with Naproxen and Robaxin, discussed with patient that they are not to drive or operate heavy machinery while taking Robaxin. I discussed treatment plan, need for PCP follow-up, and return precautions with the patient. Provided opportunity for questions, patient confirmed understanding and is in agreement with plan.   Final Clinical Impression(s) / ED Diagnoses Final diagnoses:  Acute low back pain without sciatica, unspecified back pain laterality    Rx / DC Orders ED Discharge Orders         Ordered    naproxen (NAPROSYN) 500 MG tablet  2 times daily  07/02/19 2110    methocarbamol (ROBAXIN) 500 MG tablet  Every 8 hours PRN     07/02/19 2110    lidocaine (LIDODERM) 5 %  Every 24 hours     07/02/19 2110           Desmond Lope 07/02/19 2111    Raeford Razor, MD 07/03/19 [phone]

## 2019-07-06 NOTE — Telephone Encounter (Signed)
07/02/2019 talked with pt an advised we didn't have any in office visit available.  Pt stated " are you refusing me service"  I advised I am not refusing you service as we have virtual visits available but that will not help you since you are having back pain and there is no way for the provider to assess you with out a urine and physical exam a virtual visit will not be helpful.  Pt continued to insist that we were refusing care and I reinterated  we are not we only have virtual appts available and cannot accomodate an in office visit for her today.  Pt last seen 2018 and was just a week away from being labeled as a new pt.  Pt continues to be in disagreement with my attempts to help her and says, "ok goodbye"

## 2019-07-14 ENCOUNTER — Ambulatory Visit
Admission: EM | Admit: 2019-07-14 | Discharge: 2019-07-14 | Disposition: A | Discharge status: Home / Self Care | Payer: BC Managed Care – PPO | Attending: Emergency Medicine | Admitting: Emergency Medicine

## 2019-07-14 ENCOUNTER — Encounter: Payer: Self-pay | Admitting: Emergency Medicine

## 2019-07-14 ENCOUNTER — Ambulatory Visit
Admission: EM | Admit: 2019-07-14 | Discharge: 2019-07-14 | Discharge status: Against Medical Advice | Payer: BC Managed Care – PPO

## 2019-07-14 DIAGNOSIS — R079 Chest pain, unspecified: Secondary | ICD-10-CM

## 2019-07-14 DIAGNOSIS — M7918 Myalgia, other site: Secondary | ICD-10-CM | POA: Diagnosis not present

## 2019-07-14 NOTE — ED Triage Notes (Signed)
Pt c/o chest pain with radiation to lt arm and dizziness since awaken around 530am. States used her neb tx last night prior to bed which usually does that in the morning. Pt states is currently in muscle therapy for her back/lt arm/lt knee pain. Pt in no distress. Denies SOB

## 2019-07-14 NOTE — Discharge Instructions (Signed)
Continue current therapy for back/arm pain. Go to ER for worsening chest pain, difficulty breathing, nausea, vomiting, dizziness, passing out, or you feel you are going to die

## 2019-07-14 NOTE — ED Provider Notes (Addendum)
[facility]    CSN: [id number] Arrival date & time:         History   Chief Complaint Chief Complaint  Patient presents with  . Chest Pain    HPI Annette Edwards is a 34 y.o. female with history of morbid obesity, asthma presenting for left-sided chest/thoracic back pain, left arm pain, dizziness since waking around 530 this morning.  Patient states that she used her nebulizer last night before bed as opposed to first thing the morning as is her routine.  Patient previously came to urgent care this morning for same complaint, the left due to having a therapy appointment for her left back, arm, knee pain.  Patient currently has MRI pending with outpatient specialist for her musculoskeletal pain.  Patient denies fever, cough, shortness of breath, known sick contacts, palpitations.  No personal history of stroke or heart attack, and patient denies family history of sudden/young death in family.  Patient denied nausea, vomiting, change in vision or hearing.   Past Medical History:  Diagnosis Date  . Asthma     There are no problems to display for this patient.   History reviewed. No pertinent surgical history.  OB History   No obstetric history on file.      Home Medications    Prior to Admission medications   Medication Sig Start Date End Date Taking? Authorizing Provider  albuterol (ACCUNEB) 0.63 MG/3ML nebulizer solution Take 3 mLs (0.63 mg total) by nebulization every 6 (six) hours as needed for wheezing. 12/03/18   Wieters, Hallie C, PA-C  albuterol (VENTOLIN HFA) 108 (90 Base) MCG/ACT inhaler Inhale 1-2 puffs into the lungs every 6 (six) hours as needed for wheezing or shortness of breath. 03/24/19   Hall-Potvin, [place], PA-C  fluticasone (FLONASE) 50 MCG/ACT nasal spray Place 1-2 sprays into both nostrils daily for 14 days. 12/03/18 12/17/18  Wieters, Hallie C, PA-C  lidocaine (LIDODERM) 5 % Place 1 patch onto the skin daily. Place 1 patch to the lower back  once per day. Remove & Discard patch within 12 hours. 07/02/19   Petrucelli, Samantha R, PA-C  methocarbamol (ROBAXIN) 500 MG tablet Take 1 tablet (500 mg total) by mouth every 8 (eight) hours as needed. 07/02/19   Petrucelli, Samantha R, PA-C  naproxen (NAPROSYN) 500 MG tablet Take 1 tablet (500 mg total) by mouth 2 (two) times daily. 07/02/19   Petrucelli, Glynda Jaeger, PA-C    Family History History reviewed. No pertinent family history.  Social History Social History   Tobacco Use  . Smoking status: Never Smoker  . Smokeless tobacco: Never Used  Substance Use Topics  . Alcohol use: No  . Drug use: No     Allergies   Zithromax [azithromycin]   Review of Systems As per HPI   Physical Exam Triage Vital Signs ED Triage Vitals  Enc Vitals Group     BP      Pulse      Resp      Temp      Temp src      SpO2      Weight      Height      Head Circumference      Peak Flow      Pain Score      Pain Loc      Pain Edu?      Excl. in [place]?    No data found.  Updated Vital Signs BP (!) 151/99 (BP Location: Left Arm)  Pulse (!) 103   Temp 98.3 F (36.8 C) (Oral)   Resp 18   LMP 06/25/2019 (Approximate)   SpO2 96%   Visual Acuity Right Eye Distance:   Left Eye Distance:   Bilateral Distance:    Right Eye Near:   Left Eye Near:    Bilateral Near:     Physical Exam Constitutional:      General: She is not in acute distress.    Appearance: She is well-developed. She is obese. She is not ill-appearing or diaphoretic.  HENT:     Head: Normocephalic and atraumatic.  Eyes:     General: No scleral icterus.    Pupils: Pupils are equal, round, and reactive to light.  Neck:     Thyroid: No thyromegaly.     Vascular: No JVD.     Trachea: No tracheal deviation.     Comments: Nontender Cardiovascular:     Rate and Rhythm: Normal rate and regular rhythm.     Heart sounds: No systolic murmur. No diastolic murmur.  Pulmonary:     Effort: Pulmonary effort is normal. No  tachypnea, accessory muscle usage or respiratory distress.     Breath sounds: No stridor. Decreased breath sounds present. No rales.  Musculoskeletal:     Cervical back: Neck supple.     Comments: No obvious deformity of chest, left upper extremity.  Neurovascularly intact with full active ROM.  Patient does endorse worsening pain in left latissimus dorsi, tricep with movement, the strength is intact.  No crepitus or edema over left thoracic cage.  Skin:    Coloration: Skin is not jaundiced or pale.  Neurological:     Mental Status: She is alert and oriented to person, place, and time.      UC Treatments / Results  Labs (all labs ordered are listed, but only abnormal results are displayed) Labs Reviewed - No data to display  EKG   Radiology No results found.  Procedures Procedures (including critical care time)  Medications Ordered in UC Medications - No data to display  Initial Impression / Assessment and Plan / UC Course  I have reviewed the triage vital signs and the nursing notes.  Pertinent labs & imaging results that were available during my care of the patient were reviewed by me and considered in my medical decision making (see chart for details).     Afebrile, nontoxic in office today.  No sign of respiratory distress.  Chest pain is more left thoracic per patient and appears to be consistent with musculoskeletal pain that is now chronic and being managed by outside provider.  Chest x-ray from outside facility done March 2020 negative for cardiomegaly.  EKG done in office, reviewed by me without previous to compare.  Significant for sinus tachycardia with ventricular rate of 109 bpm.  No QTC prolongation, no ST elevation or depression.  Reviewed findings with patient as well as caveat that EKGs are limited in ruling out MI in outpatient setting.  Patient verbalized understanding.  We will continue current therapy for back and arm pain, follow-up with PCP for further  evaluation/management as needed.  Provided contact information for cardiologist should patient decide to self refer.  Return precautions discussed, patient verbalized understanding and is agreeable to plan. Final Clinical Impressions(s) / UC Diagnoses   Final diagnoses:  Left-sided chest pain  Musculoskeletal pain     Discharge Instructions     Continue current therapy for back/arm pain. Go to ER for worsening chest pain, difficulty  breathing, nausea, vomiting, dizziness, passing out, or you feel you are going to die    ED Prescriptions    None     PDMP not reviewed this encounter.   Hall-Potvin, [place], PA-C 07/15/19 1232    Hall-Potvin, [place], [place] 07/15/19 1233

## 2019-07-15 ENCOUNTER — Encounter: Payer: Self-pay | Admitting: Emergency Medicine

## 2019-07-17 ENCOUNTER — Emergency Department (HOSPITAL_COMMUNITY)
Admission: EM | Admit: 2019-07-17 | Discharge: 2019-07-17 | Disposition: A | Discharge status: Home / Self Care | Payer: BC Managed Care – PPO | Attending: Emergency Medicine | Admitting: Emergency Medicine

## 2019-07-17 ENCOUNTER — Other Ambulatory Visit: Payer: Self-pay

## 2019-07-17 ENCOUNTER — Encounter (HOSPITAL_COMMUNITY): Payer: Self-pay

## 2019-07-17 ENCOUNTER — Emergency Department (HOSPITAL_COMMUNITY): Payer: BC Managed Care – PPO

## 2019-07-17 DIAGNOSIS — Y92410 Unspecified street and highway as the place of occurrence of the external cause: Secondary | ICD-10-CM | POA: Insufficient documentation

## 2019-07-17 DIAGNOSIS — S29012A Strain of muscle and tendon of back wall of thorax, initial encounter: Secondary | ICD-10-CM | POA: Insufficient documentation

## 2019-07-17 DIAGNOSIS — S3992XA Unspecified injury of lower back, initial encounter: Secondary | ICD-10-CM | POA: Diagnosis present

## 2019-07-17 DIAGNOSIS — J45909 Unspecified asthma, uncomplicated: Secondary | ICD-10-CM | POA: Insufficient documentation

## 2019-07-17 DIAGNOSIS — S239XXA Sprain of unspecified parts of thorax, initial encounter: Secondary | ICD-10-CM

## 2019-07-17 DIAGNOSIS — Y9389 Activity, other specified: Secondary | ICD-10-CM | POA: Insufficient documentation

## 2019-07-17 DIAGNOSIS — Z79899 Other long term (current) drug therapy: Secondary | ICD-10-CM | POA: Insufficient documentation

## 2019-07-17 DIAGNOSIS — Y999 Unspecified external cause status: Secondary | ICD-10-CM | POA: Insufficient documentation

## 2019-07-17 DIAGNOSIS — S39012A Strain of muscle, fascia and tendon of lower back, initial encounter: Secondary | ICD-10-CM

## 2019-07-17 LAB — POC URINE PREG, ED: Preg Test, Ur: NEGATIVE

## 2019-07-17 MED ORDER — ALBUTEROL SULFATE HFA 108 (90 BASE) MCG/ACT IN AERS
2.0000 | INHALATION_SPRAY | RESPIRATORY_TRACT | Status: DC | PRN
  Administered 2019-07-17: 2 via RESPIRATORY_TRACT
  Filled 2019-07-17: qty 6.7
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
## 2019-07-17 NOTE — ED Notes (Signed)
Pt states that she only wants to see an M.D.

## 2019-07-17 NOTE — Discharge Instructions (Addendum)
Follow-up with physical therapy and use your back pain medicines as needed.

## 2019-09-21 ENCOUNTER — Telehealth: Payer: Self-pay | Admitting: Pulmonary Disease

## 2019-09-21 NOTE — Telephone Encounter (Signed)
Patient walked into office stating she was shortness of breath , using nebulizer medication 12x a day .  Patient scheduled with Dr. Wynona Neat for 10/23/19. Patients o2 sat 95% on room air, heart rate 100. Dr. Wynona Neat came to foyer to see patient and advised for her to call 911 if she felt like she needed emergent assistance. Patient refused for our office to call 911(witnessed Irving Burton Pinion CMA) for her and she stated she would go to emergency room herself.  Advised patient our recommendations were for [place] to call 911 for her and she again stated she did not want me to do that and patient left office.

## 2019-09-21 NOTE — Telephone Encounter (Signed)
Patient was addressed by AO and Lauren.

## 2019-09-23 ENCOUNTER — Institutional Professional Consult (permissible substitution): Payer: BC Managed Care – PPO | Admitting: Pulmonary Disease

## 2021-10-01 IMAGING — CR DG LUMBAR SPINE COMPLETE 4+V
5 series · 5 of 5 positions shown · non-contrast
Comparison: None.

CLINICAL DATA: Back pain

EXAM:
LUMBAR SPINE - COMPLETE 4+ VIEW

[t lumbar spine ap]
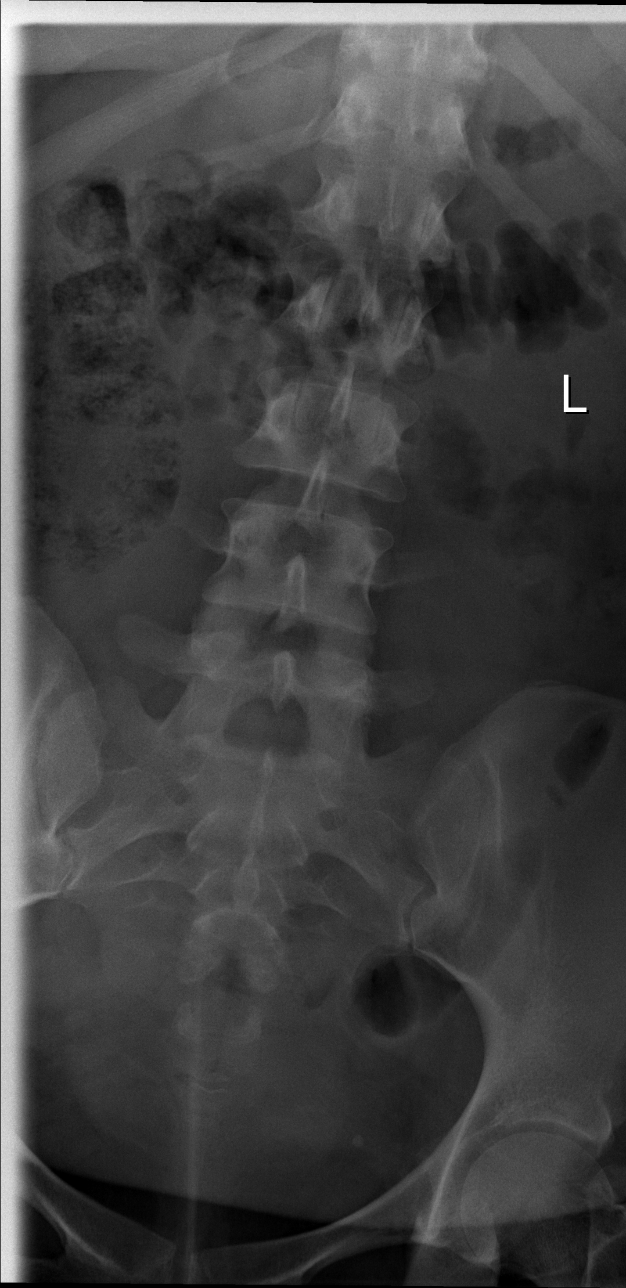

[t lumbar spine obl (1 of 2)]
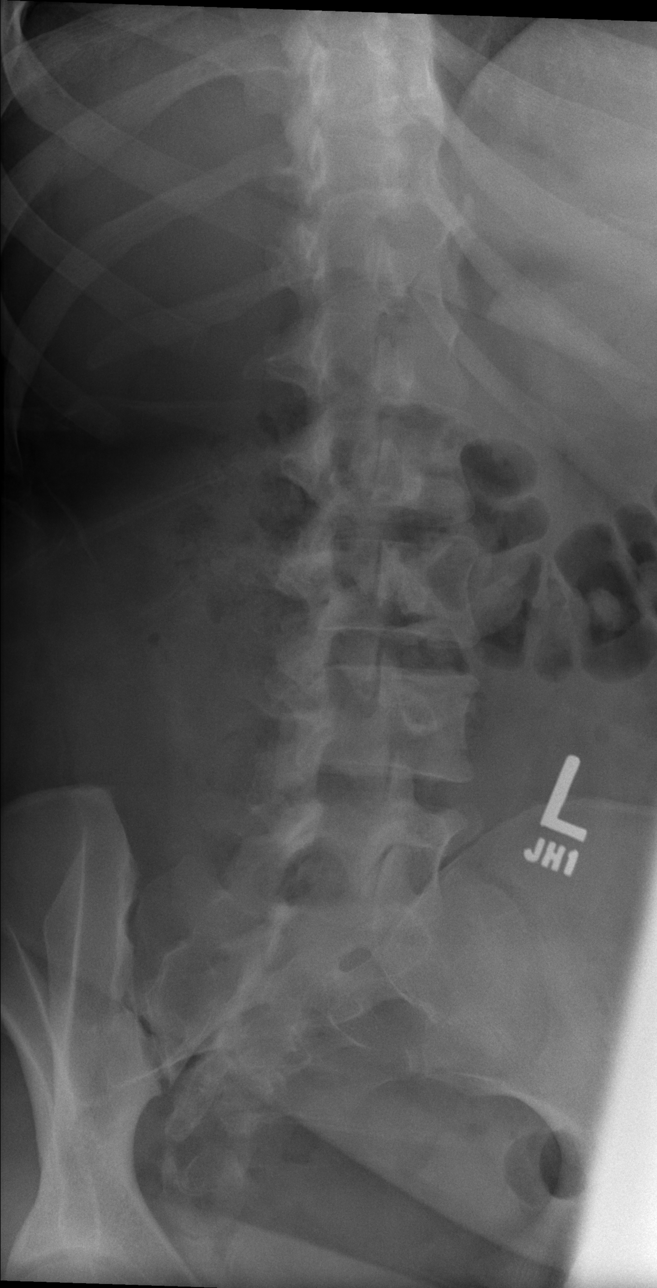

[t lumbar spine obl (2 of 2)]
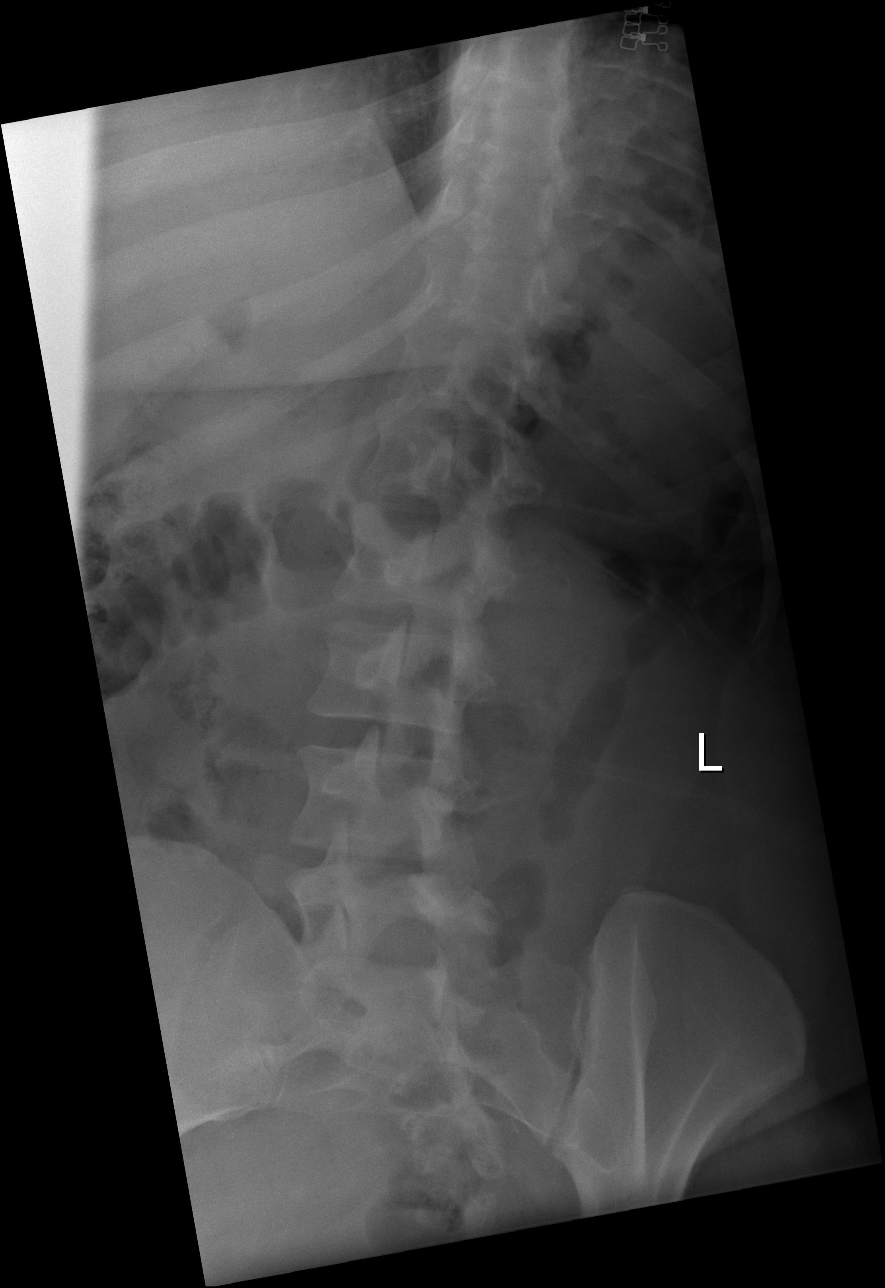

[t lumbar spine lat]
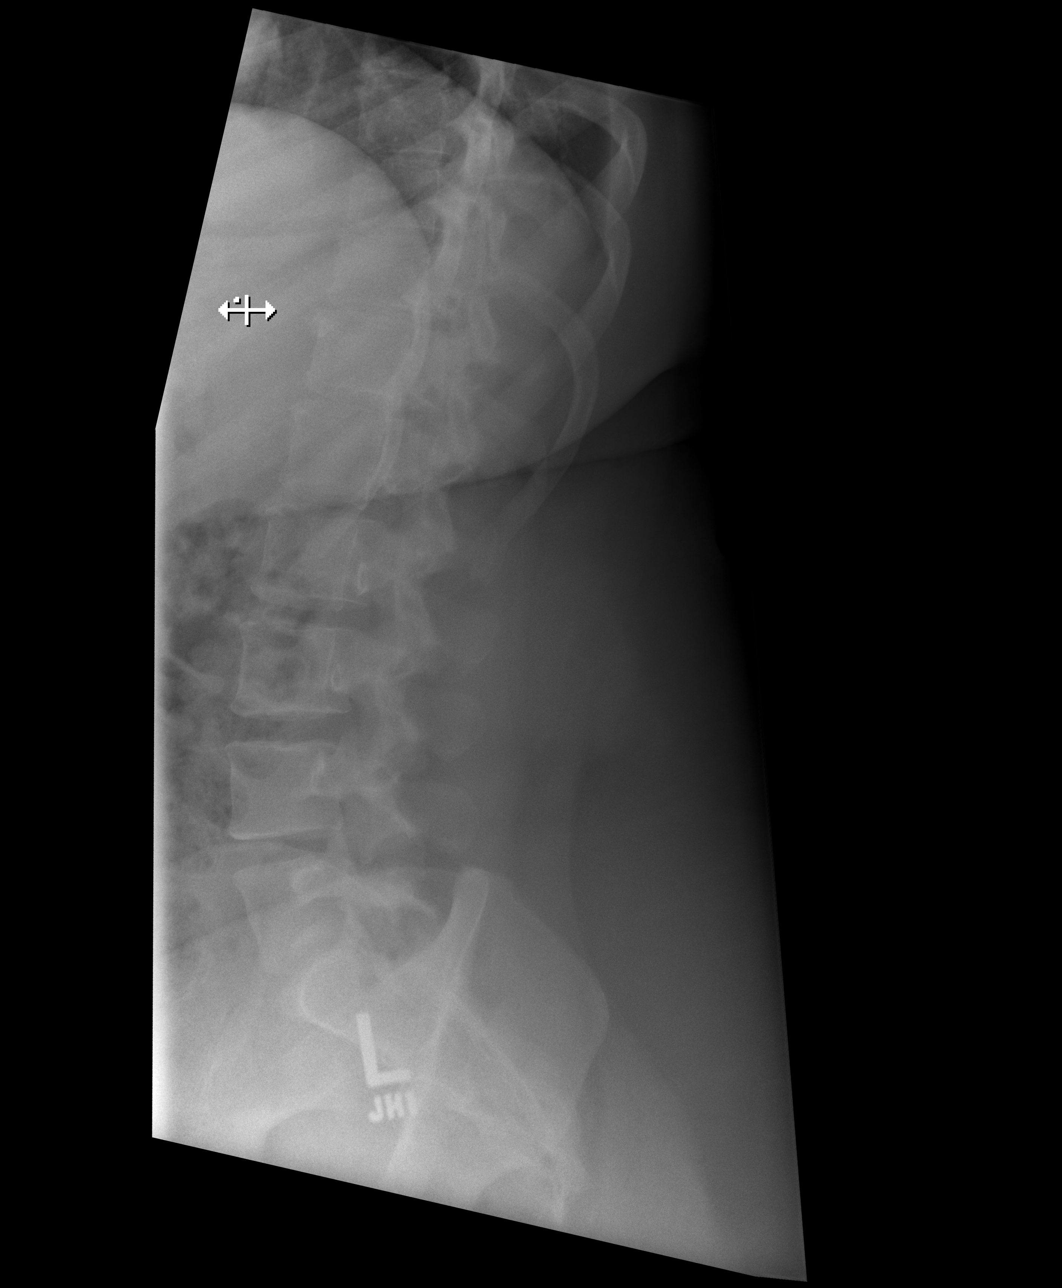

[t lumbar l-5 s-1 spot]
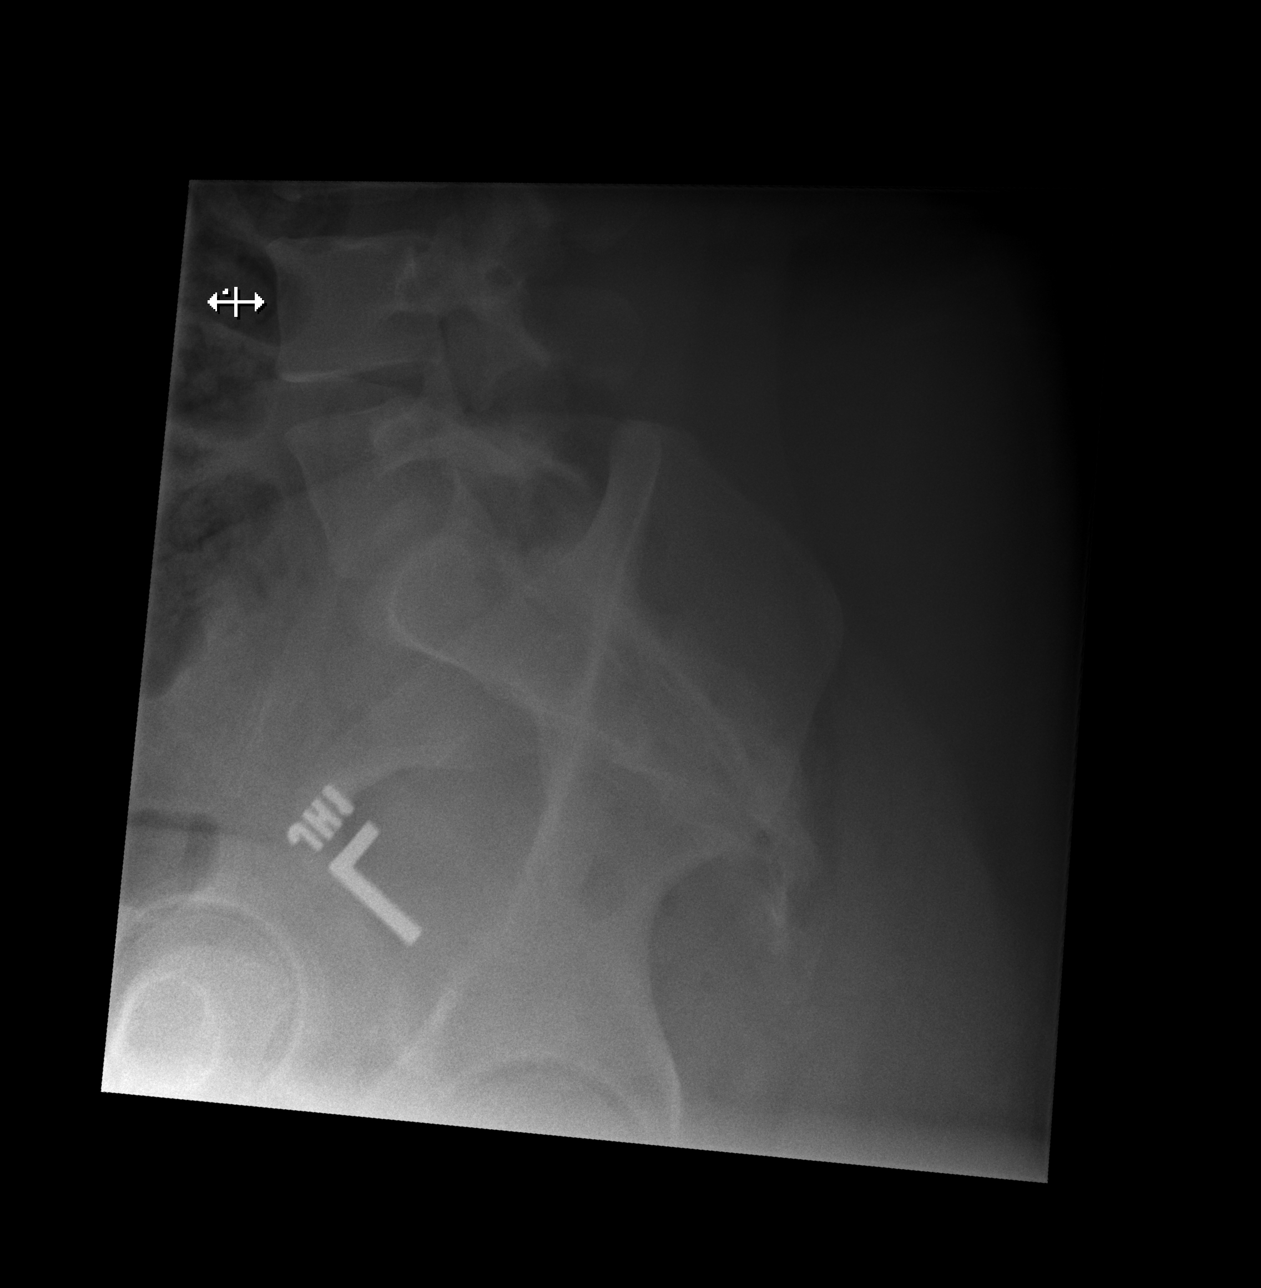

[5 of 5 positions shown; findings below may reference images not displayed]

FINDINGS: There is no evidence of lumbar spine fracture. Alignment is normal.
Intervertebral disc spaces are maintained.
IMPRESSION: Negative.
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
## 2022-05-30 ENCOUNTER — Telehealth: Payer: Self-pay

## 2022-05-30 NOTE — Telephone Encounter (Signed)
Mychart msg sent. AS, CMA 

## 2022-08-01 DIAGNOSIS — J4540 Moderate persistent asthma, uncomplicated: Secondary | ICD-10-CM | POA: Diagnosis not present

## 2024-05-10 ENCOUNTER — Ambulatory Visit: Payer: Self-pay

## 2024-05-10 NOTE — Telephone Encounter (Signed)
 [facility]   Copied from CRM [id number]. Topic: Clinical - Red Word Triage >> May 10, 2024 10:40 AM Treva T wrote: [facility] that prompted transfer to Nurse Triage: Pt is having knee, shoulder, wrist and right elbow  pain due to fall outside of pharmacy. Pt reports fall occurred on 04/26/24.  Pt would like to schedule an appt for evaluation, as she is not getting any better.  Ph. [phone]

## 2024-05-10 NOTE — Telephone Encounter (Signed)
 Unable to reach patient, not established with PC.
